# Patient Record
Sex: Female | Born: 1960 | Race: Black or African American | Hispanic: No | Marital: Single | Smoking: Current every day smoker
Health system: Southern US, Community
[De-identification: ages and names within clinical notes are randomized; demographics above are authoritative.]

## PROBLEM LIST (undated history)

## (undated) DIAGNOSIS — E785 Hyperlipidemia, unspecified: Secondary | ICD-10-CM

## (undated) DIAGNOSIS — E875 Hyperkalemia: Secondary | ICD-10-CM

## (undated) DIAGNOSIS — I639 Cerebral infarction, unspecified: Secondary | ICD-10-CM

## (undated) DIAGNOSIS — F1410 Cocaine abuse, uncomplicated: Secondary | ICD-10-CM

## (undated) DIAGNOSIS — N289 Disorder of kidney and ureter, unspecified: Secondary | ICD-10-CM

## (undated) DIAGNOSIS — H409 Unspecified glaucoma: Secondary | ICD-10-CM

## (undated) DIAGNOSIS — E119 Type 2 diabetes mellitus without complications: Secondary | ICD-10-CM

## (undated) DIAGNOSIS — F329 Major depressive disorder, single episode, unspecified: Secondary | ICD-10-CM

## (undated) DIAGNOSIS — I10 Essential (primary) hypertension: Secondary | ICD-10-CM

---

## 2004-03-24 ENCOUNTER — Emergency Department (HOSPITAL_COMMUNITY): Admission: EM | Admit: 2004-03-24 | Discharge: 2004-03-24 | Payer: Self-pay | Admitting: Unknown Physician Specialty

## 2004-07-30 ENCOUNTER — Emergency Department (HOSPITAL_COMMUNITY): Admission: EM | Admit: 2004-07-30 | Discharge: 2004-07-30 | Payer: Self-pay | Admitting: Emergency Medicine

## 2004-10-13 ENCOUNTER — Emergency Department (HOSPITAL_COMMUNITY): Admission: EM | Admit: 2004-10-13 | Discharge: 2004-10-13 | Payer: Self-pay | Admitting: *Deleted

## 2005-02-18 ENCOUNTER — Ambulatory Visit (HOSPITAL_COMMUNITY): Admission: RE | Admit: 2005-02-18 | Discharge: 2005-02-18 | Payer: Self-pay | Admitting: Internal Medicine

## 2006-03-30 ENCOUNTER — Emergency Department (HOSPITAL_COMMUNITY): Admission: EM | Admit: 2006-03-30 | Discharge: 2006-03-30 | Payer: Self-pay | Admitting: Emergency Medicine

## 2009-07-26 ENCOUNTER — Emergency Department (HOSPITAL_COMMUNITY): Admission: EM | Admit: 2009-07-26 | Discharge: 2009-07-26 | Payer: Self-pay | Admitting: Emergency Medicine

## 2010-01-30 ENCOUNTER — Emergency Department (HOSPITAL_COMMUNITY): Admission: EM | Admit: 2010-01-30 | Discharge: 2010-01-30 | Payer: Self-pay | Admitting: Emergency Medicine

## 2010-03-21 ENCOUNTER — Other Ambulatory Visit: Admission: RE | Admit: 2010-03-21 | Discharge: 2010-03-21 | Payer: Self-pay | Admitting: Family Medicine

## 2011-01-13 ENCOUNTER — Encounter: Payer: Self-pay | Admitting: Family Medicine

## 2016-12-23 DIAGNOSIS — R45851 Suicidal ideations: Secondary | ICD-10-CM

## 2016-12-23 HISTORY — DX: Suicidal ideations: R45.851

## 2019-12-24 DIAGNOSIS — D649 Anemia, unspecified: Secondary | ICD-10-CM

## 2019-12-24 DIAGNOSIS — Z720 Tobacco use: Secondary | ICD-10-CM

## 2019-12-24 HISTORY — DX: Anemia, unspecified: D64.9

## 2019-12-24 HISTORY — DX: Tobacco use: Z72.0

## 2021-05-12 ENCOUNTER — Emergency Department (HOSPITAL_COMMUNITY)
Admission: EM | Admit: 2021-05-12 | Discharge: 2021-05-13 | Disposition: A | Discharge status: Home / Self Care | Payer: Medicare Other | Attending: Emergency Medicine | Admitting: Emergency Medicine

## 2021-05-12 ENCOUNTER — Emergency Department (HOSPITAL_COMMUNITY): Payer: Medicare Other

## 2021-05-12 ENCOUNTER — Encounter (HOSPITAL_COMMUNITY): Payer: Self-pay

## 2021-05-12 ENCOUNTER — Other Ambulatory Visit: Payer: Self-pay

## 2021-05-12 DIAGNOSIS — L97429 Non-pressure chronic ulcer of left heel and midfoot with unspecified severity: Secondary | ICD-10-CM | POA: Diagnosis present

## 2021-05-12 DIAGNOSIS — D649 Anemia, unspecified: Secondary | ICD-10-CM | POA: Insufficient documentation

## 2021-05-12 DIAGNOSIS — I129 Hypertensive chronic kidney disease with stage 1 through stage 4 chronic kidney disease, or unspecified chronic kidney disease: Secondary | ICD-10-CM | POA: Insufficient documentation

## 2021-05-12 DIAGNOSIS — N184 Chronic kidney disease, stage 4 (severe): Secondary | ICD-10-CM | POA: Diagnosis not present

## 2021-05-12 DIAGNOSIS — F17210 Nicotine dependence, cigarettes, uncomplicated: Secondary | ICD-10-CM | POA: Insufficient documentation

## 2021-05-12 DIAGNOSIS — Z4800 Encounter for change or removal of nonsurgical wound dressing: Secondary | ICD-10-CM | POA: Insufficient documentation

## 2021-05-12 DIAGNOSIS — E1122 Type 2 diabetes mellitus with diabetic chronic kidney disease: Secondary | ICD-10-CM | POA: Diagnosis not present

## 2021-05-12 DIAGNOSIS — F17200 Nicotine dependence, unspecified, uncomplicated: Secondary | ICD-10-CM | POA: Diagnosis not present

## 2021-05-12 DIAGNOSIS — Z9012 Acquired absence of left breast and nipple: Secondary | ICD-10-CM | POA: Diagnosis not present

## 2021-05-12 DIAGNOSIS — R5383 Other fatigue: Secondary | ICD-10-CM | POA: Diagnosis not present

## 2021-05-12 DIAGNOSIS — Z5189 Encounter for other specified aftercare: Secondary | ICD-10-CM

## 2021-05-12 HISTORY — DX: Disorder of kidney and ureter, unspecified: N28.9

## 2021-05-12 HISTORY — DX: Unspecified glaucoma: H40.9

## 2021-05-12 HISTORY — DX: Hyperlipidemia, unspecified: E78.5

## 2021-05-12 HISTORY — DX: Cocaine abuse, uncomplicated: F14.10

## 2021-05-12 HISTORY — DX: Major depressive disorder, single episode, unspecified: F32.9

## 2021-05-12 HISTORY — DX: Essential (primary) hypertension: I10

## 2021-05-12 HISTORY — DX: Cerebral infarction, unspecified: I63.9

## 2021-05-12 HISTORY — DX: Hyperkalemia: E87.5

## 2021-05-12 HISTORY — DX: Type 2 diabetes mellitus without complications: E11.9

## 2021-05-12 LAB — COMPREHENSIVE METABOLIC PANEL
ALT: 18 U/L (ref 0–44)
AST: 21 U/L (ref 15–41)
Albumin: 3.2 g/dL — ABNORMAL LOW (ref 3.5–5.0)
Alkaline Phosphatase: 86 U/L (ref 38–126)
Anion gap: 7 (ref 5–15)
BUN: 32 mg/dL — ABNORMAL HIGH (ref 8–23)
CO2: 20 mmol/L — ABNORMAL LOW (ref 22–32)
Calcium: 9 mg/dL (ref 8.9–10.3)
Chloride: 116 mmol/L — ABNORMAL HIGH (ref 98–111)
Creatinine, Ser: 2.62 mg/dL — ABNORMAL HIGH (ref 0.44–1.00)
GFR, Estimated: 20 mL/min — ABNORMAL LOW (ref >60)
Glucose, Bld: 144 mg/dL — ABNORMAL HIGH (ref 70–99)
Potassium: 5.5 mmol/L — ABNORMAL HIGH (ref 3.5–5.1)
Sodium: 143 mmol/L (ref 135–145)
Total Bilirubin: 0.5 mg/dL (ref 0.3–1.2)
Total Protein: 6.9 g/dL (ref 6.5–8.1)

## 2021-05-12 LAB — CBC WITH DIFFERENTIAL/PLATELET
Abs Immature Granulocytes: 0.05 10*3/uL (ref 0.00–0.07)
Basophils Absolute: 0 10*3/uL (ref 0.0–0.1)
Basophils Relative: 0 %
Eosinophils Absolute: 0.3 10*3/uL (ref 0.0–0.5)
Eosinophils Relative: 3 %
HCT: 22.4 % — ABNORMAL LOW (ref 36.0–46.0)
Hemoglobin: 6.9 g/dL — CL (ref 12.0–15.0)
Immature Granulocytes: 0 %
Lymphocytes Relative: 12 %
Lymphs Abs: 1.4 10*3/uL (ref 0.7–4.0)
MCH: 30.5 pg (ref 26.0–34.0)
MCHC: 30.8 g/dL (ref 30.0–36.0)
MCV: 99.1 fL (ref 80.0–100.0)
Monocytes Absolute: 0.6 10*3/uL (ref 0.1–1.0)
Monocytes Relative: 5 %
Neutro Abs: 9.5 10*3/uL — ABNORMAL HIGH (ref 1.7–7.7)
Neutrophils Relative %: 80 %
Platelets: 334 10*3/uL (ref 150–400)
RBC: 2.26 MIL/uL — ABNORMAL LOW (ref 3.87–5.11)
RDW: 15.9 % — ABNORMAL HIGH (ref 11.5–15.5)
WBC: 11.8 10*3/uL — ABNORMAL HIGH (ref 4.0–10.5)
nRBC: 0 % (ref 0.0–0.2)

## 2021-05-12 LAB — PREPARE RBC (CROSSMATCH)

## 2021-05-12 MED ORDER — SODIUM CHLORIDE 0.9% IV SOLUTION
Freq: Once | INTRAVENOUS | Status: AC
Start: 2021-05-12 — End: 2021-05-13

## 2021-05-12 NOTE — ED Notes (Signed)
Date and time results received: 05/12/21 9:55 PM(use smartphrase ".now" to insert current time)  Test: Hbg Critical Value: 6.9  Name of Provider Notified: Evalee Jefferson, PA  Orders Received? Or Actions Taken?: see chart

## 2021-05-12 NOTE — ED Provider Notes (Incomplete)
[facility] EMERGENCY DEPARTMENT Provider Note   CSN: [id number] Arrival date & time: 05/12/21  1949     History Chief Complaint  Patient presents with  . Wound Check    Kaylee Cantu is a 60 y.o. 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  This is a patient who has been under the care of Dr. Vira Blanco with [facility], unfortunately her caregiver had apparently abandoned her as he has disappeared and therefore the son has moved her into his home here in [place] just this month.  Patient and the son had an office visit with this provider just 2 days ago upon review of CareEverywhere to discuss transferring care to providers in [place].  In the interim, son is very concerned about her foot ulcer but also questions her medical stability as he is new and unfamiliar with her medical needs.   Review of her previous chart reveals need for red blood cell transfusions recently secondary to symptomatic anemia, other recent labs indicate average creatinine level in the mid 2.5 region with potassium levels from 5.3-5.7  The patient herself denies any acute symptoms, but does endorse generalized fatigue.  She has had no fevers or chills, no nausea or vomiting, abdominal pain, shortness of breath or chest pain.  She does report feeling weak when she stands.   HPI     Past Medical History:  Diagnosis Date  . Acute CVA (cerebrovascular accident) ([place])   . Anemia 2021  . Cocaine abuse ([place])   . Diabetes mellitus without complication ([place])   . Glaucoma   . Hyperkalemia   . Hyperlipidemia   . Hypertension   . MDD (major depressive disorder)   . Renal disorder    CKD Stage 4  . Suicidal ideation 2018  . Tobacco abuse 2021    There  are no problems to display for this patient.   Past Surgical History:  Procedure Laterality Date  . BREAST LUMPECTOMY Left   . CATARACT EXTRACTION W/ INTRAOCULAR LENS IMPLANT Right      OB History   No obstetric history on file.     History reviewed. No pertinent family history.  Social History   Tobacco Use  . Smoking status: Current Every Day Smoker    Packs/day: 0.50    Years: 42.00    Pack years: 21.00    Types: Cigarettes  Vaping Use  . Vaping Use: Unknown  Substance Use Topics  . Alcohol use: Yes  . Drug use: Yes    Types: Cocaine    Home Medications Prior to Admission medications   Not on File    Allergies    Morphine, Other, and Iodinated diagnostic agents  Review of Systems   Review of Systems  Constitutional: Positive for fatigue. Negative for chills and fever.  HENT: Negative for congestion and sore throat.   Eyes: Positive for visual disturbance.       Legally blind  Respiratory: Negative for chest tightness and shortness of breath.   Cardiovascular: Negative for chest pain.  Gastrointestinal: Negative for abdominal pain, nausea and vomiting.  Genitourinary: Negative.   Musculoskeletal: Negative for arthralgias, joint swelling and neck pain.  Skin: Positive for wound. Negative for rash.  Neurological: Positive for weakness. Negative  for dizziness, light-headedness, numbness and headaches.  Psychiatric/Behavioral: Negative.   All other systems reviewed and are negative.   Physical Exam Updated Vital Signs BP 135/67 (BP Location: Right Arm)   Pulse 91   Temp 97.9 F (36.6 C) (Oral)   Resp 18   Ht '5\' 5"'$  (1.651 m)   Wt 51.3 kg   SpO2 100%   BMI 18.80 kg/m   Physical Exam Vitals and nursing note reviewed.  Constitutional:      General: She is not in acute distress.    Appearance: She is well-developed.  HENT:     Head: Normocephalic and atraumatic.  Eyes:     Conjunctiva/sclera: Conjunctivae normal.     Comments: Pale conjunctiva   Cardiovascular:     Rate and Rhythm: Normal rate and regular rhythm.     Heart sounds: Normal heart sounds.  Pulmonary:     Effort: Pulmonary effort is normal.     Breath sounds: Normal breath sounds. No wheezing.  Abdominal:     General: Bowel sounds are normal.     Palpations: Abdomen is soft.     Tenderness: There is no abdominal tenderness.  Musculoskeletal:        General: Normal range of motion.     Cervical back: Normal range of motion.     Right lower leg: No edema.     Left lower leg: No edema.  Skin:    General: Skin is warm and dry.     Comments: NoSuperficial ulceration medial left approximately 5 cm diameter, superficial, no erythema or drainage present, no eschar, the wound appears dry.  Neurological:     Mental Status: She is alert.     ED Results / Procedures / Treatments   Labs (all labs ordered are listed, but only abnormal results are displayed) Labs Reviewed  CBC WITH DIFFERENTIAL/PLATELET - Abnormal; Notable for the following components:      Result Value   WBC 11.8 (*)    RBC 2.26 (*)    Hemoglobin 6.9 (*)    HCT 22.4 (*)    RDW 15.9 (*)    Neutro Abs 9.5 (*)    All other components within normal limits  COMPREHENSIVE METABOLIC PANEL - Abnormal; Notable for the following components:   Potassium 5.5 (*)    Chloride 116 (*)    CO2 20 (*)    Glucose, Bld 144 (*)    BUN 32 (*)    Creatinine, Ser 2.62 (*)    Albumin 3.2 (*)    GFR, Estimated 20 (*)    All other components within normal limits  TYPE AND SCREEN  PREPARE RBC (CROSSMATCH)  ABO/RH    EKG None  Radiology DG Foot 2 Views Left  Result Date: 05/12/2021 CLINICAL DATA:  Left foot pain. EXAM: LEFT FOOT - 2 VIEW COMPARISON:  None. FINDINGS: The bones are diffusely under mineralized. No fracture or dislocation. No erosion or bony destruction. Tiny plantar calcaneal spur and Achilles tendon enthesophyte. Talonavicular degenerative change. Advanced vascular calcifications. No focal soft  tissue abnormality. No soft tissue air or radiopaque foreign body. IMPRESSION: 1. No acute findings. 2. Talonavicular degenerative change. Tiny plantar calcaneal spur and Achilles tendon enthesophyte. 3. Osteopenia/osteoporosis.  Advanced vascular calcifications. Electronically Signed   By: Keith Rake M.D.   On: 05/12/2021 21:50    Procedures Procedures {Remember to document critical care time when appropriate:1}  Medications Ordered in ED Medications - No data to display  ED Course  I have reviewed  the triage vital signs and the nursing notes.  Pertinent labs & imaging results that were available during my care of the patient were reviewed by me and considered in my medical decision making (see chart for details).    MDM Rules/Calculators/A&P                          Imaging reviewed, no signs of subcu cutaneous gas or bony involvement regarding her chronic foot ulceration.  Her wound was dressed.  Labs reviewed, patient has significant anemia at 6.9 along with weakness and lightheadedness with standing, therefore Final Clinical Impression(s) / ED Diagnoses Final diagnoses:  None    Rx / DC Orders ED Discharge Orders    None

## 2021-05-12 NOTE — ED Provider Notes (Signed)
[facility] EMERGENCY DEPARTMENT Provider Note   CSN: [id number] Arrival date & time: 05/12/21  1949     History Chief Complaint  Patient presents with  . Wound Check    Kaylee Cantu is a 60 y.o. 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  This is a patient who has been under the care of Dr. Vira Blanco with [facility], unfortunately her caregiver had apparently abandoned her as he has disappeared and therefore the son has moved her into his home here in [place] just this month.  Patient and the son had an office visit with this provider just 2 days ago upon review of CareEverywhere to discuss transferring care to providers in [place].  In the interim, son is very concerned about her foot ulcer but also questions her medical stability as he is new and unfamiliar with her medical needs.   Review of her previous chart reveals need for red blood cell transfusions recently secondary to symptomatic anemia, other recent labs indicate average creatinine level in the mid 2.5 region with potassium levels from 5.3-5.7  The patient herself denies any acute symptoms, but does endorse generalized fatigue.  She has had no fevers or chills, no nausea or vomiting, abdominal pain, shortness of breath or chest pain.  She does report feeling weak when she stands.   HPI     Past Medical History:  Diagnosis Date  . Acute CVA (cerebrovascular accident) ([place])   . Anemia 2021  . Cocaine abuse ([place])   . Diabetes mellitus without complication ([place])   . Glaucoma   . Hyperkalemia   . Hyperlipidemia   . Hypertension   . MDD (major depressive disorder)   . Renal disorder    CKD Stage 4  . Suicidal ideation 2018  . Tobacco abuse 2021    There  are no problems to display for this patient.   Past Surgical History:  Procedure Laterality Date  . BREAST LUMPECTOMY Left   . CATARACT EXTRACTION W/ INTRAOCULAR LENS IMPLANT Right      OB History   No obstetric history on file.     History reviewed. No pertinent family history.  Social History   Tobacco Use  . Smoking status: Current Every Day Smoker    Packs/day: 0.50    Years: 42.00    Pack years: 21.00    Types: Cigarettes  Vaping Use  . Vaping Use: Unknown  Substance Use Topics  . Alcohol use: Yes  . Drug use: Yes    Types: Cocaine    Home Medications Prior to Admission medications   Not on File    Allergies    Morphine, Other, and Iodinated diagnostic agents  Review of Systems   Review of Systems  Constitutional: Positive for fatigue. Negative for chills and fever.  HENT: Negative for congestion and sore throat.   Eyes: Positive for visual disturbance.       Legally blind  Respiratory: Negative for chest tightness and shortness of breath.   Cardiovascular: Negative for chest pain.  Gastrointestinal: Negative for abdominal pain, nausea and vomiting.  Genitourinary: Negative.   Musculoskeletal: Negative for arthralgias, joint swelling and neck pain.  Skin: Positive for wound. Negative for rash.  Neurological: Positive for weakness. Negative  for dizziness, light-headedness, numbness and headaches.  Psychiatric/Behavioral: Negative.   All other systems reviewed and are negative.   Physical Exam Updated Vital Signs BP (!) 178/74   Pulse 100   Temp 98.1 F (36.7 C)   Resp 13   Ht '5\' 5"'$  (1.651 m)   Wt 51.3 kg   SpO2 100%   BMI 18.80 kg/m   Physical Exam Vitals and nursing note reviewed.  Constitutional:      General: She is not in acute distress.    Appearance: She is well-developed.  HENT:     Head: Normocephalic and atraumatic.  Eyes:     Conjunctiva/sclera: Conjunctivae normal.     Comments: Pale conjunctiva  Cardiovascular:     Rate  and Rhythm: Normal rate and regular rhythm.     Heart sounds: Normal heart sounds.  Pulmonary:     Effort: Pulmonary effort is normal.     Breath sounds: Normal breath sounds. No wheezing.  Abdominal:     General: Bowel sounds are normal.     Palpations: Abdomen is soft.     Tenderness: There is no abdominal tenderness.  Musculoskeletal:        General: Normal range of motion.     Cervical back: Normal range of motion.     Right lower leg: No edema.     Left lower leg: No edema.  Skin:    General: Skin is warm and dry.     Comments: NoSuperficial ulceration medial left approximately 5 cm diameter, superficial, no erythema or drainage present, no eschar, the wound appears dry.  Neurological:     Mental Status: She is alert.     ED Results / Procedures / Treatments   Labs (all labs ordered are listed, but only abnormal results are displayed) Labs Reviewed  CBC WITH DIFFERENTIAL/PLATELET - Abnormal; Notable for the following components:      Result Value   WBC 11.8 (*)    RBC 2.26 (*)    Hemoglobin 6.9 (*)    HCT 22.4 (*)    RDW 15.9 (*)    Neutro Abs 9.5 (*)    All other components within normal limits  COMPREHENSIVE METABOLIC PANEL - Abnormal; Notable for the following components:   Potassium 5.5 (*)    Chloride 116 (*)    CO2 20 (*)    Glucose, Bld 144 (*)    BUN 32 (*)    Creatinine, Ser 2.62 (*)    Albumin 3.2 (*)    GFR, Estimated 20 (*)    All other components within normal limits  TYPE AND SCREEN  PREPARE RBC (CROSSMATCH)  ABO/RH    EKG None  Radiology DG Foot 2 Views Left  Result Date: 05/12/2021 CLINICAL DATA:  Left foot pain. EXAM: LEFT FOOT - 2 VIEW COMPARISON:  None. FINDINGS: The bones are diffusely under mineralized. No fracture or dislocation. No erosion or bony destruction. Tiny plantar calcaneal spur and Achilles tendon enthesophyte. Talonavicular degenerative change. Advanced vascular calcifications. No focal soft tissue abnormality. No soft  tissue air or radiopaque foreign body. IMPRESSION: 1. No acute findings. 2. Talonavicular degenerative change. Tiny plantar calcaneal spur and Achilles tendon enthesophyte. 3. Osteopenia/osteoporosis.  Advanced vascular calcifications. Electronically Signed   By: Keith Rake M.D.   On: 05/12/2021 21:50    Procedures Procedures   Medications Ordered in ED Medications  0.9 %  sodium chloride infusion (Manually program via Guardrails IV Fluids) (has no administration in time range)    ED  Course  I have reviewed the triage vital signs and the nursing notes.  Pertinent labs & imaging results that were available during my care of the patient were reviewed by me and considered in my medical decision making (see chart for details).    MDM Rules/Calculators/A&P                          Imaging reviewed, no signs of subcu cutaneous gas or bony involvement regarding her chronic foot ulceration.  Her wound was dressed.  Labs reviewed, patient has significant anemia at 6.9 along with weakness and lightheadedness with standing, therefore she was offered a transfusion of packed red blood cells which she accepted.  Her other lab normalities including her creatinine and her potassium levels are stable at this time.  She will require close follow-up with a primary care provider.  Son at the bedside states he is undecided whether he will actually establish care with local providers as he is in conversation with the brother who may choose to move in to her home with her in [place], therefore she will not need to change her medical providers.  She will be given referrals for local providers here including hematology, renal, PCP and podiatry in the event she ends up settling here.  Patient discussed with Dr. Stark Jock who will dispo patient once her transfusion is completed. Final Clinical Impression(s) / ED Diagnoses Final diagnoses:  Anemia, unspecified type  Visit for wound check    Rx / DC Orders ED  Discharge Orders    None       Landis Martins 05/13/21 0018    Milton Ferguson, MD 05/13/21 1101

## 2021-05-12 NOTE — ED Triage Notes (Signed)
Pt presents with son tonight from home. Pt recently started being cared for by son. Pt has significant medical history with [facility]. Pt was previously seeing wound care for left foot. Son concerned for infection.

## 2021-05-13 DIAGNOSIS — L97429 Non-pressure chronic ulcer of left heel and midfoot with unspecified severity: Secondary | ICD-10-CM | POA: Diagnosis not present

## 2021-05-13 LAB — ABO/RH: ABO/RH(D): B NEG

## 2021-05-13 MED ORDER — ACETAMINOPHEN 325 MG PO TABS
650.0000 mg | ORAL_TABLET | Freq: Once | ORAL | Status: AC
  Administered 2021-05-13: 650 mg via ORAL
  Filled 2021-05-13: qty 2

## 2021-05-13 NOTE — Discharge Instructions (Addendum)
Refer to the list of MD's for establishment of local care if you move to [place] permanently.

## 2021-05-14 LAB — BPAM RBC
Blood Product Expiration Date: 202206132359
ISSUE DATE / TIME: 202205220100
Unit Type and Rh: 1700

## 2021-05-14 LAB — TYPE AND SCREEN
ABO/RH(D): B NEG
Antibody Screen: NEGATIVE
Unit division: 0

## 2021-10-23 DEATH — deceased

## 2022-02-22 IMAGING — DX DG FOOT 2V*L*
2 series · 2 of 2 positions shown · non-contrast
Comparison: None.

CLINICAL DATA: Left foot pain.

EXAM:
LEFT FOOT - 2 VIEW

[foot ap]
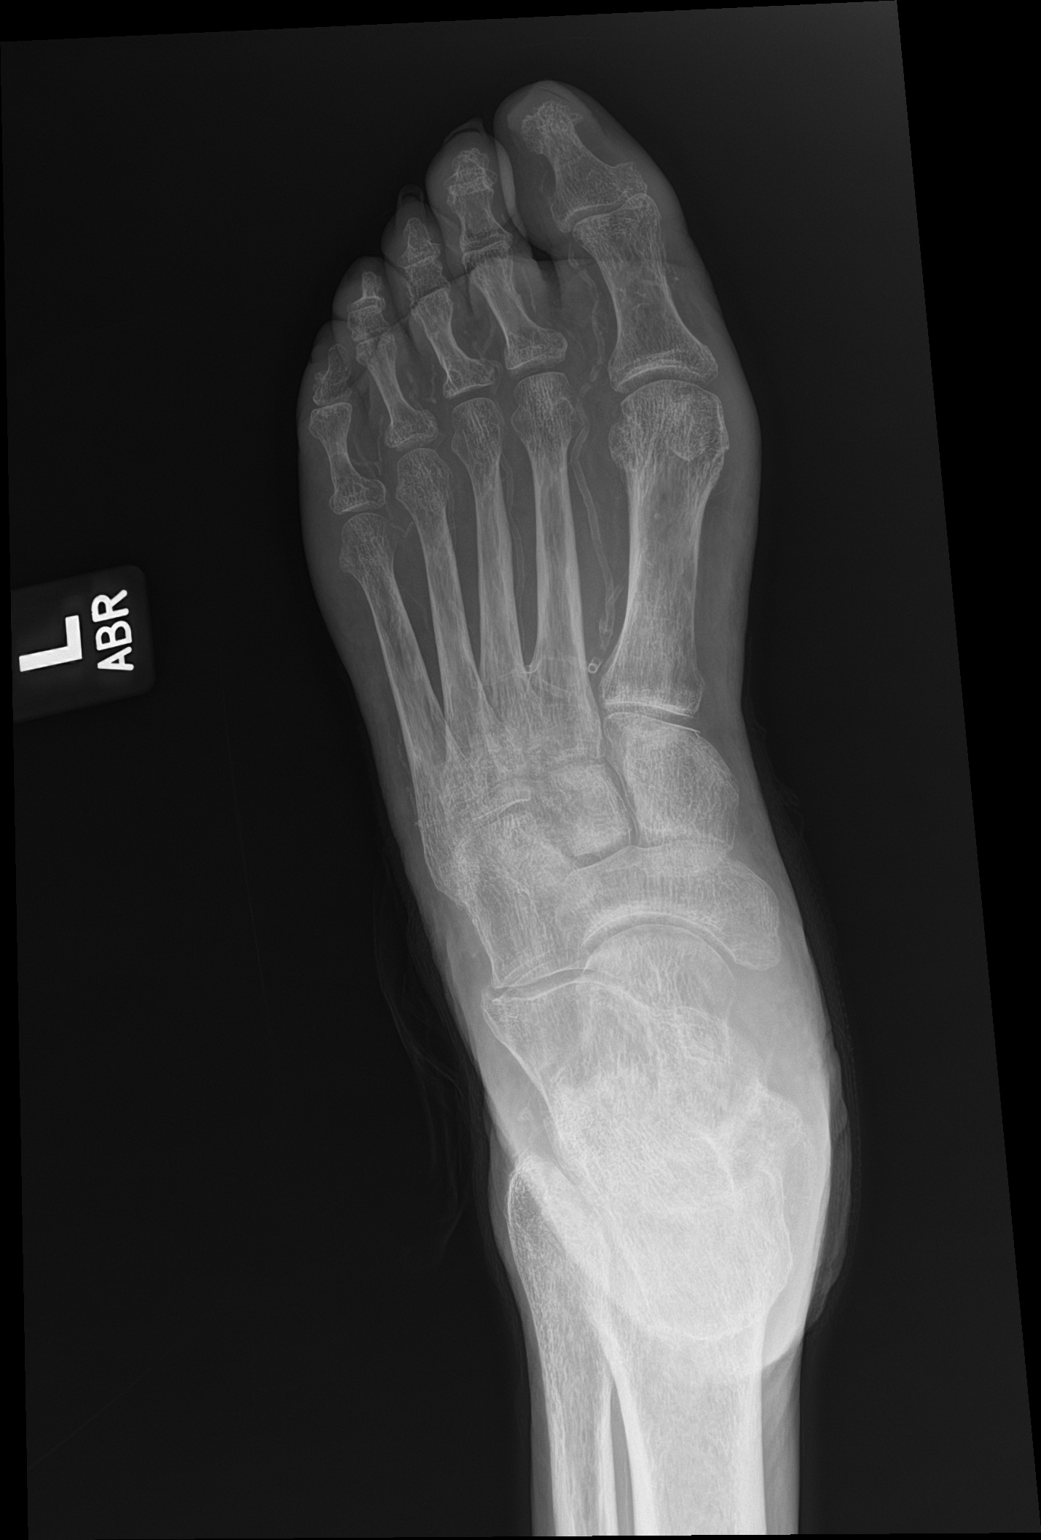

[foot lat]
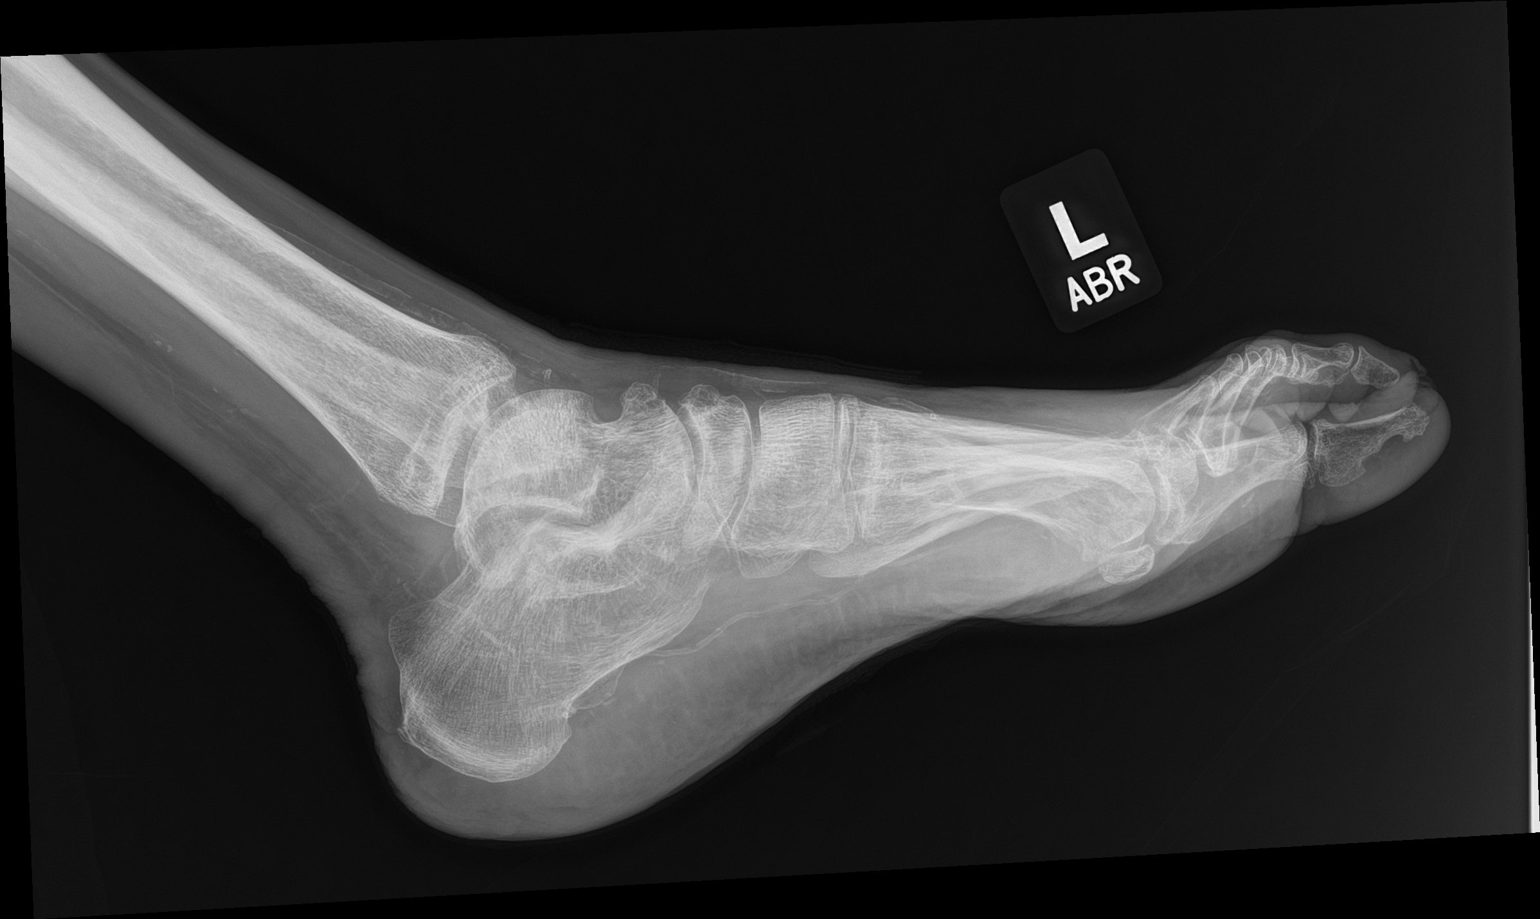

[2 of 2 positions shown; findings below may reference images not displayed]

FINDINGS: The bones are diffusely under mineralized. No fracture or
dislocation. No erosion or bony destruction. Tiny plantar calcaneal
spur and Achilles tendon enthesophyte. Talonavicular degenerative
change. Advanced vascular calcifications. No focal soft tissue
abnormality. No soft tissue air or radiopaque foreign body.
IMPRESSION: 1. No acute findings.
2. Talonavicular degenerative change. Tiny plantar calcaneal spur
and Achilles tendon enthesophyte.
3. Osteopenia/osteoporosis.  Advanced vascular calcifications.
